# Patient Record
Sex: Female | Born: 1965 | Race: White | Hispanic: No | Marital: Single | Smoking: Current every day smoker
Health system: Southern US, Community
[De-identification: ages and names within clinical notes are randomized; demographics above are authoritative.]

## PROBLEM LIST (undated history)

## (undated) DIAGNOSIS — F909 Attention-deficit hyperactivity disorder, unspecified type: Secondary | ICD-10-CM

## (undated) DIAGNOSIS — J449 Chronic obstructive pulmonary disease, unspecified: Secondary | ICD-10-CM

## (undated) DIAGNOSIS — F3181 Bipolar II disorder: Secondary | ICD-10-CM

## (undated) DIAGNOSIS — O2230 Deep phlebothrombosis in pregnancy, unspecified trimester: Secondary | ICD-10-CM

## (undated) DIAGNOSIS — E079 Disorder of thyroid, unspecified: Secondary | ICD-10-CM

## (undated) DIAGNOSIS — I10 Essential (primary) hypertension: Secondary | ICD-10-CM

## (undated) DIAGNOSIS — I2699 Other pulmonary embolism without acute cor pulmonale: Secondary | ICD-10-CM

## (undated) HISTORY — PX: CHOLECYSTECTOMY: SHX55

## (undated) HISTORY — PX: KNEE SURGERY: SHX244

---

## 2017-12-29 ENCOUNTER — Emergency Department (HOSPITAL_COMMUNITY): Payer: Medicare PPO

## 2017-12-29 ENCOUNTER — Emergency Department (HOSPITAL_COMMUNITY)
Admission: EM | Admit: 2017-12-29 | Discharge: 2017-12-29 | Disposition: A | Discharge status: Home / Self Care | Payer: Medicare PPO | Attending: Emergency Medicine | Admitting: Emergency Medicine

## 2017-12-29 ENCOUNTER — Other Ambulatory Visit: Payer: Self-pay

## 2017-12-29 ENCOUNTER — Encounter (HOSPITAL_COMMUNITY): Payer: Self-pay

## 2017-12-29 DIAGNOSIS — Y999 Unspecified external cause status: Secondary | ICD-10-CM | POA: Insufficient documentation

## 2017-12-29 DIAGNOSIS — J449 Chronic obstructive pulmonary disease, unspecified: Secondary | ICD-10-CM | POA: Diagnosis not present

## 2017-12-29 DIAGNOSIS — E079 Disorder of thyroid, unspecified: Secondary | ICD-10-CM | POA: Diagnosis not present

## 2017-12-29 DIAGNOSIS — Y9389 Activity, other specified: Secondary | ICD-10-CM | POA: Diagnosis not present

## 2017-12-29 DIAGNOSIS — S20211A Contusion of right front wall of thorax, initial encounter: Secondary | ICD-10-CM | POA: Diagnosis not present

## 2017-12-29 DIAGNOSIS — F17210 Nicotine dependence, cigarettes, uncomplicated: Secondary | ICD-10-CM | POA: Insufficient documentation

## 2017-12-29 DIAGNOSIS — S1093XA Contusion of unspecified part of neck, initial encounter: Secondary | ICD-10-CM

## 2017-12-29 DIAGNOSIS — Y92411 Interstate highway as the place of occurrence of the external cause: Secondary | ICD-10-CM | POA: Diagnosis not present

## 2017-12-29 DIAGNOSIS — S1083XA Contusion of other specified part of neck, initial encounter: Secondary | ICD-10-CM | POA: Diagnosis not present

## 2017-12-29 DIAGNOSIS — I10 Essential (primary) hypertension: Secondary | ICD-10-CM | POA: Diagnosis not present

## 2017-12-29 HISTORY — DX: Chronic obstructive pulmonary disease, unspecified: J44.9

## 2017-12-29 HISTORY — DX: Disorder of thyroid, unspecified: E07.9

## 2017-12-29 HISTORY — DX: Essential (primary) hypertension: I10

## 2017-12-29 HISTORY — DX: Other pulmonary embolism without acute cor pulmonale: I26.99

## 2017-12-29 HISTORY — DX: Attention-deficit hyperactivity disorder, unspecified type: F90.9

## 2017-12-29 HISTORY — DX: Deep phlebothrombosis in pregnancy, unspecified trimester: O22.30

## 2017-12-29 HISTORY — DX: Bipolar II disorder: F31.81

## 2017-12-29 LAB — CBC WITH DIFFERENTIAL/PLATELET
Abs Immature Granulocytes: 0.03 10*3/uL (ref 0.00–0.07)
BASOS PCT: 0 %
Basophils Absolute: 0 10*3/uL (ref 0.0–0.1)
Eosinophils Absolute: 0.5 10*3/uL (ref 0.0–0.5)
Eosinophils Relative: 5 %
HCT: 40.7 % (ref 36.0–46.0)
Hemoglobin: 13.1 g/dL (ref 12.0–15.0)
IMMATURE GRANULOCYTES: 0 %
Lymphocytes Relative: 35 %
Lymphs Abs: 3.2 10*3/uL (ref 0.7–4.0)
MCH: 28.7 pg (ref 26.0–34.0)
MCHC: 32.2 g/dL (ref 30.0–36.0)
MCV: 89.3 fL (ref 80.0–100.0)
Monocytes Absolute: 0.5 10*3/uL (ref 0.1–1.0)
Monocytes Relative: 5 %
NEUTROS ABS: 5 10*3/uL (ref 1.7–7.7)
NEUTROS PCT: 55 %
PLATELETS: 236 10*3/uL (ref 150–400)
RBC: 4.56 MIL/uL (ref 3.87–5.11)
RDW: 13.2 % (ref 11.5–15.5)
WBC: 9.2 10*3/uL (ref 4.0–10.5)
nRBC: 0 % (ref 0.0–0.2)

## 2017-12-29 LAB — BASIC METABOLIC PANEL
Anion gap: 8 (ref 5–15)
BUN: 13 mg/dL (ref 6–20)
CO2: 24 mmol/L (ref 22–32)
Calcium: 8.7 mg/dL — ABNORMAL LOW (ref 8.9–10.3)
Chloride: 105 mmol/L (ref 98–111)
Creatinine, Ser: 0.75 mg/dL (ref 0.44–1.00)
GFR calc Af Amer: >60 mL/min (ref >60)
GFR calc non Af Amer: >60 mL/min (ref >60)
GLUCOSE: 136 mg/dL — AB (ref 70–99)
Potassium: 4.1 mmol/L (ref 3.5–5.1)
Sodium: 137 mmol/L (ref 135–145)

## 2017-12-29 LAB — I-STAT CHEM 8, ED
BUN: 15 mg/dL (ref 6–20)
Calcium, Ion: 1.11 mmol/L — ABNORMAL LOW (ref 1.15–1.40)
Chloride: 106 mmol/L (ref 98–111)
Creatinine, Ser: 0.7 mg/dL (ref 0.44–1.00)
Glucose, Bld: 131 mg/dL — ABNORMAL HIGH (ref 70–99)
HCT: 38 % (ref 36.0–46.0)
Hemoglobin: 12.9 g/dL (ref 12.0–15.0)
Potassium: 4 mmol/L (ref 3.5–5.1)
Sodium: 139 mmol/L (ref 135–145)
TCO2: 26 mmol/L (ref 22–32)

## 2017-12-29 MED ORDER — IOHEXOL 300 MG/ML  SOLN
75.0000 mL | Freq: Once | INTRAMUSCULAR | Status: AC | PRN
  Administered 2017-12-29: 75 mL via INTRAVENOUS

## 2017-12-29 MED ORDER — HYDROCODONE-ACETAMINOPHEN 5-325 MG PO TABS: 1.0000 | ORAL_TABLET | ORAL | 0 refills | Status: AC | PRN

## 2017-12-29 NOTE — ED Notes (Signed)
Patient verbalizes understanding of discharge instructions. Opportunity for questioning and answers were provided. Armband removed by staff, pt discharged from ED.  
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
## 2017-12-29 NOTE — ED Notes (Signed)
Patient transported to CT 

## 2017-12-29 NOTE — ED Notes (Signed)
Patient transported to X-ray 
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
